# Patient Record
Sex: Male | Born: 1995 | Race: White | Marital: Single | Smoking: Never smoker
Health system: Southern US, Community
[De-identification: ages and names within clinical notes are randomized; demographics above are authoritative.]

---
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
## 2017-02-28 ENCOUNTER — Ambulatory Visit (INDEPENDENT_AMBULATORY_CARE_PROVIDER_SITE_OTHER): Payer: 59 | Admitting: Family Medicine

## 2017-02-28 ENCOUNTER — Encounter: Payer: Self-pay | Admitting: Family Medicine

## 2017-02-28 VITALS — BP 125/59 | HR 81 | Temp 97.5°F | Resp 14

## 2017-02-28 DIAGNOSIS — K120 Recurrent oral aphthae: Secondary | ICD-10-CM

## 2017-02-28 DIAGNOSIS — K529 Noninfective gastroenteritis and colitis, unspecified: Secondary | ICD-10-CM

## 2017-02-28 MED ORDER — ONDANSETRON 4 MG PO TBDP: 4.0000 mg | ORAL_TABLET | Freq: Three times a day (TID) | ORAL | 0 refills | Status: DC | PRN

## 2017-03-01 LAB — POCT INFLUENZA A/B
INFLUENZA B, POC: NEGATIVE
Influenza A, POC: NEGATIVE

## 2017-03-01 NOTE — Progress Notes (Signed)
Patient presents today with 2-3 days of vomiting/loose bowel movements. Patient denies any abdominal pain, melena, hematochezia, fever, severe headache, sore throat, cough. He is unsure if he ate something that was undercooked. He also has a sore inside his lower lip area and inside the left cheek area. He has been able to keep Gatorade down this morning without vomiting. He has not eaten anything today. Denies any recent antibiotic use or any new medications. Denies any alcohol use recently.  ROS: Negative except mentioned above.  GENERAL: NAD HEENT: no pharyngeal erythema, no exudate, no erythema of TMs, no cervical LAD, aphthous ulcer noted inside portion of right lower lip area, no lesions on the lips RESP: CTA B CARD: RRR ABD: Positive bowel sounds, nontender, no rebound or guarding appreciated, no flank tenderness NEURO: CN II-XII grossly intact   A/P: 1)Gastroenteritis - rest, hydration, Zofran when necessary, advance diet slowly, discussed BRAT Diet, no athletic activity or going to class until symptoms have resolved. Encourage patient to talk to academic advisor and trainer. Seek medical attention if symptoms persist or worsen as discussed.  2)Aphthous ulcer - will treat with Kenalog in [place], encourage patient to take multivitamin, Tylenol/ibuprofen when necessary, seek medical attention if symptoms persist or worsen as discussed.

## 2017-03-01 NOTE — Addendum Note (Signed)
Addended by: Dione HousekeeperPATEL, Isadore Palecek N on: 03/01/2017 10:37 AM   Modules accepted: Orders

## 2017-06-14 ENCOUNTER — Ambulatory Visit (INDEPENDENT_AMBULATORY_CARE_PROVIDER_SITE_OTHER): Payer: PRIVATE HEALTH INSURANCE | Admitting: Family Medicine

## 2017-06-14 ENCOUNTER — Encounter: Payer: Self-pay | Admitting: Family Medicine

## 2017-06-14 VITALS — BP 103/66 | HR 73 | Temp 97.6°F | Resp 14

## 2017-06-14 DIAGNOSIS — B349 Viral infection, unspecified: Secondary | ICD-10-CM

## 2017-06-14 LAB — POCT INFLUENZA A/B
INFLUENZA A, POC: NEGATIVE
INFLUENZA B, POC: NEGATIVE

## 2017-06-14 NOTE — Progress Notes (Signed)
Patient presents today with symptoms of nasal drainage, cough, myalgias, headache, fever which started 4 days ago. Patient also had episode of vomiting and loose bowel movement 3 days ago. He has been able to tolerate fluid and food now. He denies any sore throat, chest pain, shortness of breath, severe headache and photophobia, neck stiffness. He states that his symptoms are better now. He has been taking Zyrtec and Ibuprofen intermittently.  ROS: Negative except mentioned above. Vitals as per Epic. GENERAL: NAD HEENT: mild pharyngeal erythema, no exudate, no erythema of TMs, no cervical LAD RESP: CTA B CARD: RRR ABD: Nontender, no organomegaly appreciated NEURO: CN II-XII grossly intact   A/P: Viral Illness - influenza screen was negative in the office however symptoms sound suggestive of influenza/viral illness, discussed over-the-counter supportive care medications, rest, hydration, seek medical attention if symptoms persist or worsen as discussed. No athletic activity or class if febrile

## 2018-01-12 ENCOUNTER — Ambulatory Visit
Admission: RE | Admit: 2018-01-12 | Discharge: 2018-01-12 | Disposition: A | Discharge status: Home / Self Care | Payer: 59 | Source: Ambulatory Visit | Attending: Family Medicine | Admitting: Family Medicine

## 2018-01-12 ENCOUNTER — Other Ambulatory Visit: Payer: Self-pay | Admitting: Family Medicine

## 2018-01-12 DIAGNOSIS — M7989 Other specified soft tissue disorders: Secondary | ICD-10-CM | POA: Diagnosis not present

## 2018-01-12 DIAGNOSIS — S99912A Unspecified injury of left ankle, initial encounter: Secondary | ICD-10-CM

## 2018-01-17 ENCOUNTER — Other Ambulatory Visit: Payer: Self-pay | Admitting: Family Medicine

## 2018-01-17 MED ORDER — NAPROXEN 500 MG PO TABS: 500.0000 mg | ORAL_TABLET | Freq: Two times a day (BID) | ORAL | 0 refills | Status: DC

## 2018-03-13 ENCOUNTER — Ambulatory Visit (INDEPENDENT_AMBULATORY_CARE_PROVIDER_SITE_OTHER): Payer: 59 | Admitting: Family Medicine

## 2018-03-13 ENCOUNTER — Encounter: Payer: Self-pay | Admitting: Family Medicine

## 2018-03-13 VITALS — BP 130/76 | HR 62 | Temp 97.8°F | Resp 14

## 2018-03-13 DIAGNOSIS — R05 Cough: Secondary | ICD-10-CM

## 2018-03-13 DIAGNOSIS — R059 Cough, unspecified: Secondary | ICD-10-CM

## 2018-03-13 DIAGNOSIS — J029 Acute pharyngitis, unspecified: Secondary | ICD-10-CM

## 2018-03-13 LAB — POCT INFLUENZA A/B
INFLUENZA B, POC: NEGATIVE
Influenza A, POC: NEGATIVE

## 2018-03-13 LAB — POCT RAPID STREP A (OFFICE): Rapid Strep A Screen: POSITIVE — AB

## 2018-03-13 MED ORDER — AMOXICILLIN 500 MG PO CAPS: 500.0000 mg | ORAL_CAPSULE | Freq: Two times a day (BID) | ORAL | 0 refills | Status: AC

## 2018-03-13 NOTE — Progress Notes (Signed)
Patient presents today with symptoms of sore throat, postnasal drip, minimal cough.  Patient states that he has vomited once.  He is unsure whether this was related to coughing.  His symptom have been going on for the last 4 days.  He admits to a subjective fever.  He denies any abdominal pain, diarrhea, severe headache, extreme fatigue.  He rates his sore throat a 7 out of 10.  ROS: Negative except mentioned above. Vitals as per epic. GENERAL: NAD HEENT: moderate pharyngeal erythema, no exudate, no erythema of TMs, shotty cervical LAD RESP: CTA B CARD: RRR ABD: Positive bowel sounds, nontender, no rebound or guarding appreciated, no organomegaly appreciated NEURO: CN II-XII grossly intact   A/P: Strep. Pharyngitis -rapid strep test was positive, flu screen was negative, Amoxicillin prescribed, Claritin as needed nasal drainage, Tylenol/Ibuprofen as needed, Delsym as needed, rest, hydration, no athletic activity or class today, patient needs to be afebrile for at least 24 hours without fever lowering medication to return back to physical activity and class, seek medical attention if symptoms persist or worsen as discussed.

## 2018-12-05 IMAGING — CR DG LUMBAR SPINE COMPLETE 4+V
1 series · 6 of 6 positions shown · non-contrast
Comparison: None.

CLINICAL DATA: Midline low back pain for several months. No known
injury.

EXAM:
LUMBAR SPINE - COMPLETE 4+ VIEW

[Series 1: dg lumbar spine complete 4 +v · 0.14mm/px · 6 of 6 slices shown]
[im 1/6]
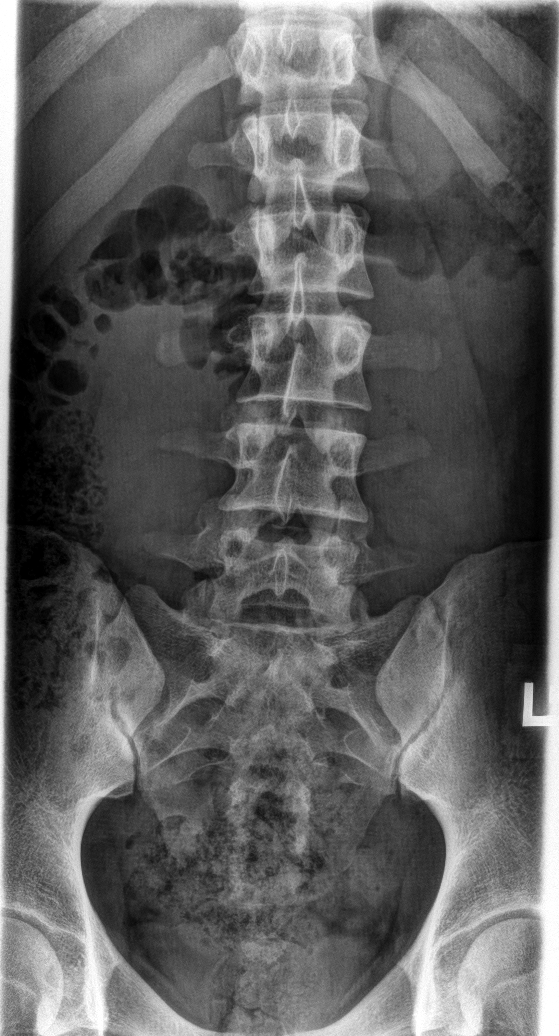
[im 2/6]
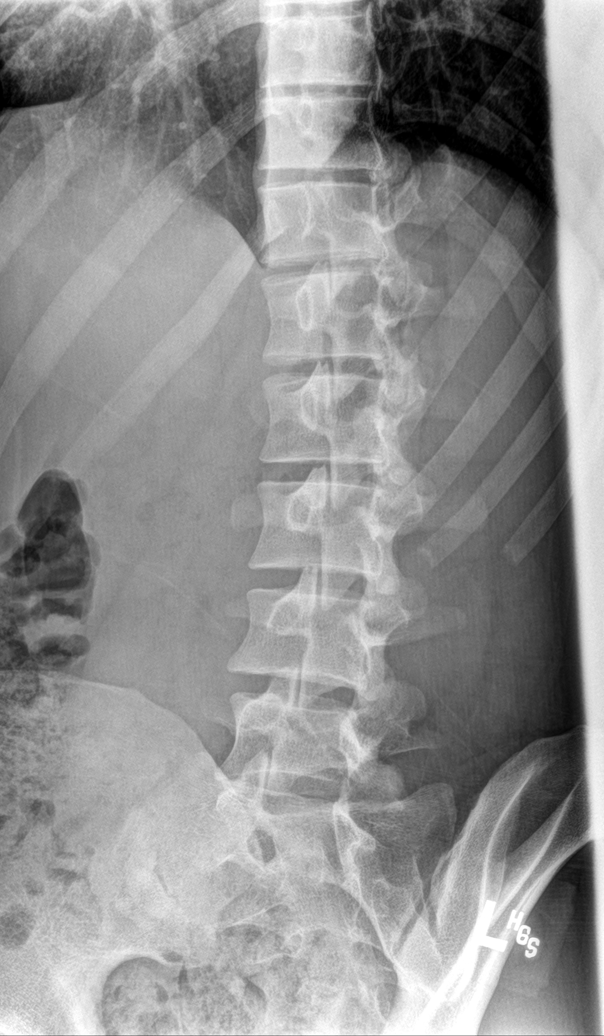
[im 3/6]
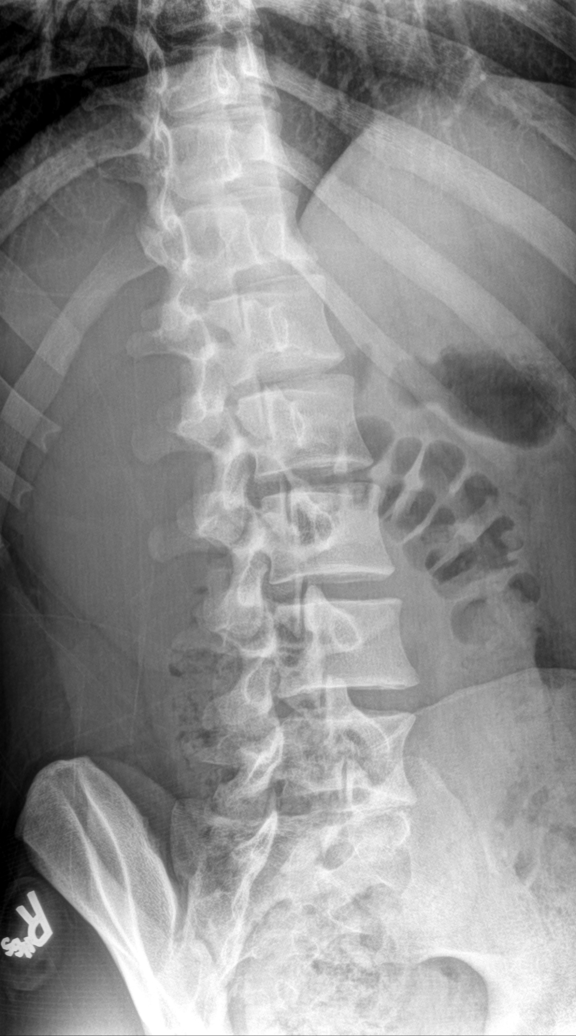
[im 4/6]
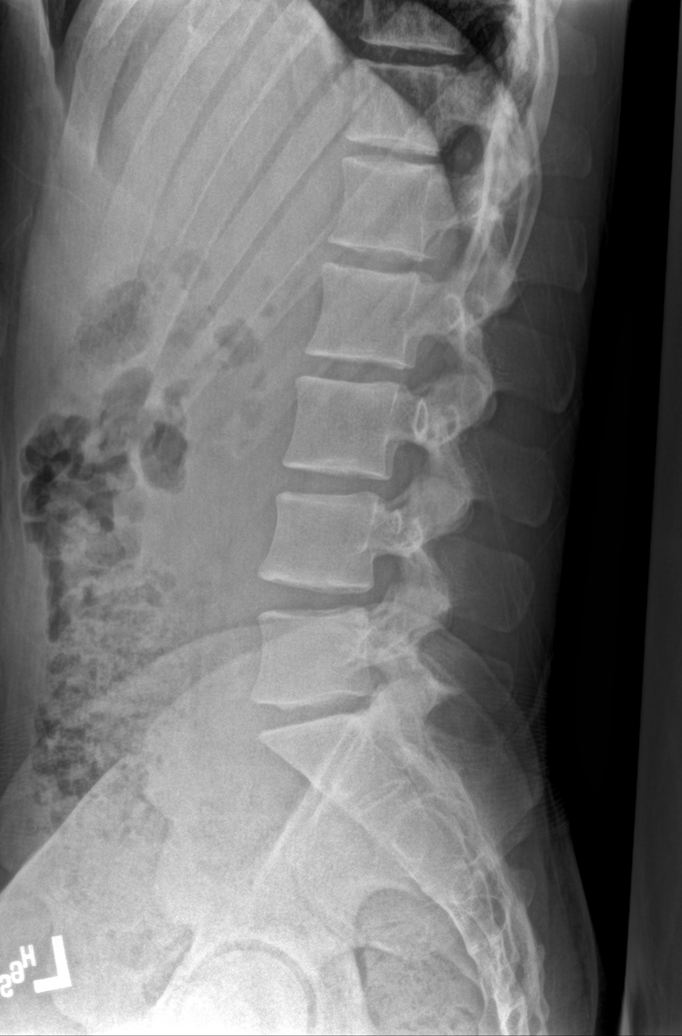
[im 5/6]
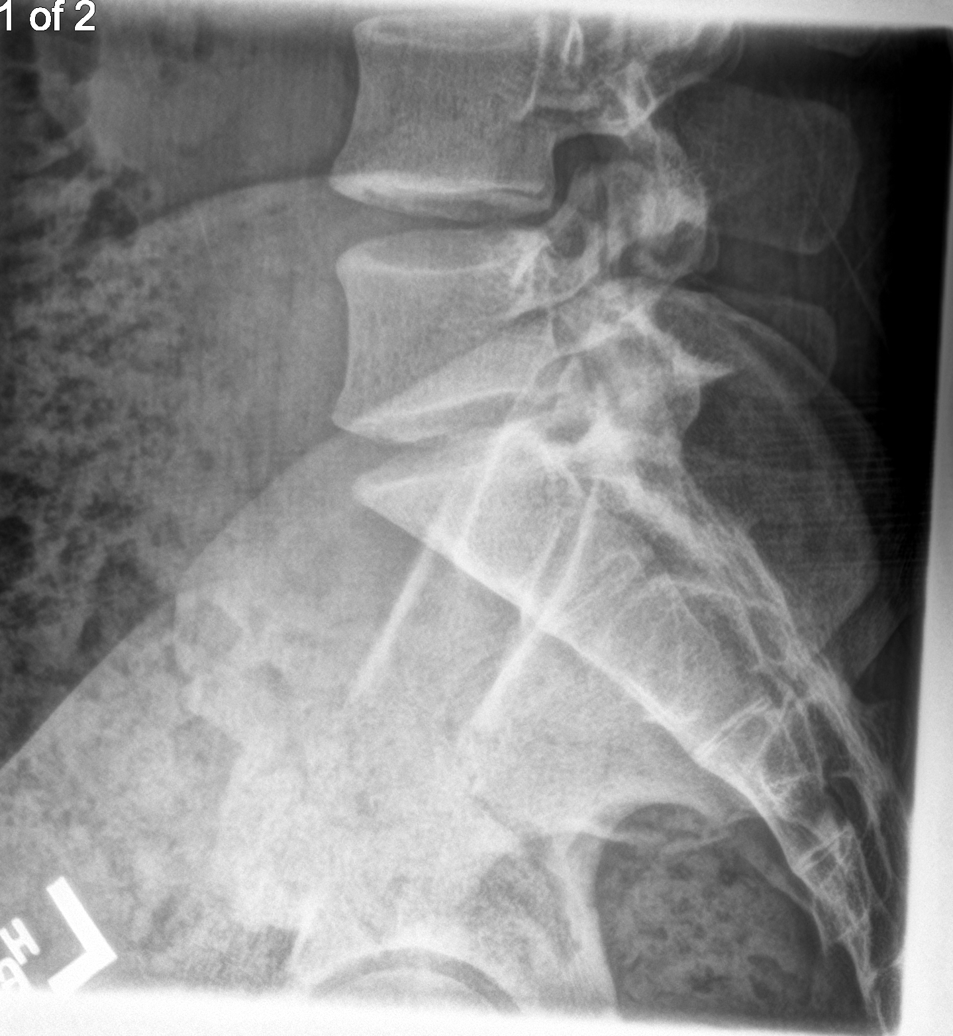
[im 6/6]
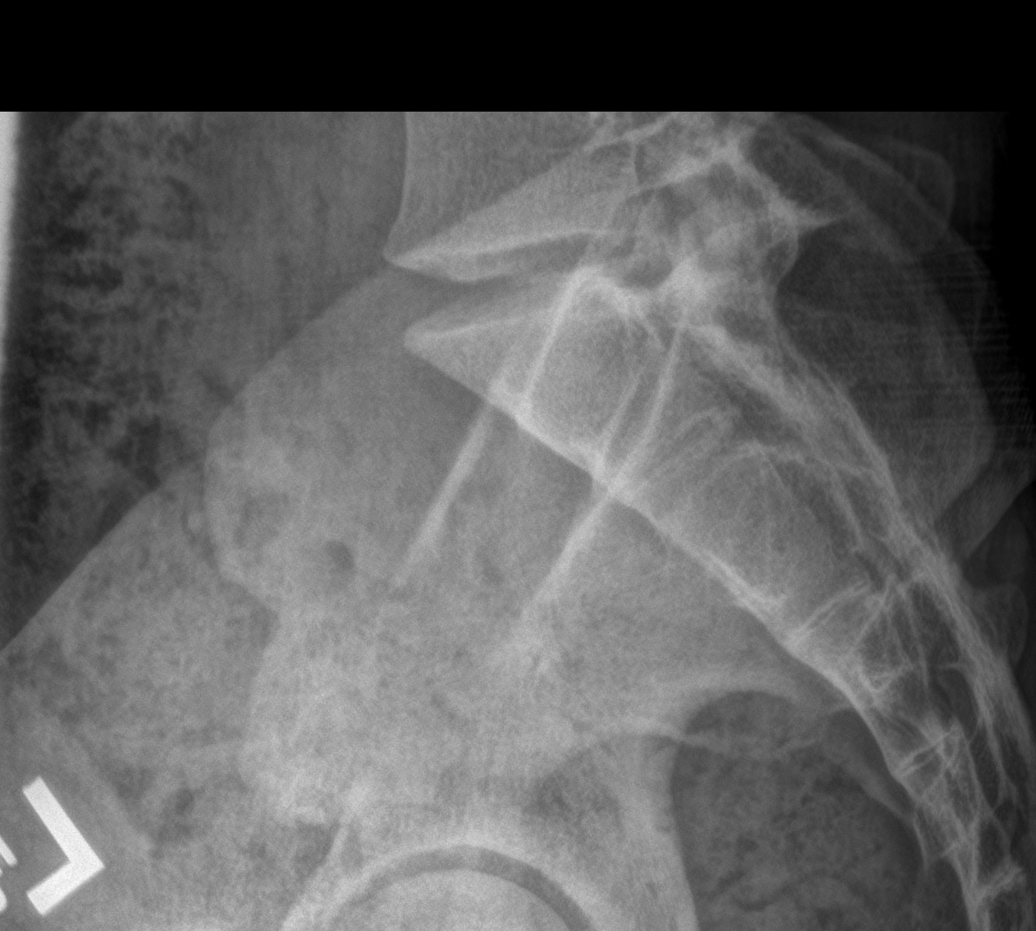

[6 of 6 positions shown; findings below may reference images not displayed]

FINDINGS: There is no evidence of lumbar spine fracture. Alignment is normal.
There is mild convex left curvature. Straightening of lordosis is
noted. Intervertebral disc spaces are maintained.
IMPRESSION: Straightening of lumbar lordosis and mild convex left curvature. The
study is otherwise negative.
